# Patient Record
Sex: Female | Born: 1985 | Race: White | Hispanic: No | Marital: Married | Smoking: Current every day smoker
Health system: Southern US, Community
[De-identification: ages and names within clinical notes are randomized; demographics above are authoritative.]

## PROBLEM LIST (undated history)

## (undated) DIAGNOSIS — I10 Essential (primary) hypertension: Secondary | ICD-10-CM

## (undated) DIAGNOSIS — J45909 Unspecified asthma, uncomplicated: Secondary | ICD-10-CM

## (undated) HISTORY — PX: TUBAL LIGATION: SHX77

---

## 2021-01-03 ENCOUNTER — Encounter (HOSPITAL_BASED_OUTPATIENT_CLINIC_OR_DEPARTMENT_OTHER): Payer: Self-pay

## 2021-01-03 ENCOUNTER — Emergency Department (HOSPITAL_BASED_OUTPATIENT_CLINIC_OR_DEPARTMENT_OTHER)
Admission: EM | Admit: 2021-01-03 | Discharge: 2021-01-03 | Discharge status: Absent without leave | Payer: BC Managed Care – PPO

## 2021-01-03 ENCOUNTER — Emergency Department (HOSPITAL_BASED_OUTPATIENT_CLINIC_OR_DEPARTMENT_OTHER): Payer: BC Managed Care – PPO

## 2021-01-03 ENCOUNTER — Emergency Department (HOSPITAL_BASED_OUTPATIENT_CLINIC_OR_DEPARTMENT_OTHER)
Admission: EM | Admit: 2021-01-03 | Discharge: 2021-01-03 | Disposition: A | Discharge status: Home / Self Care | Payer: BC Managed Care – PPO | Attending: Emergency Medicine | Admitting: Emergency Medicine

## 2021-01-03 ENCOUNTER — Other Ambulatory Visit: Payer: Self-pay

## 2021-01-03 DIAGNOSIS — R519 Headache, unspecified: Secondary | ICD-10-CM | POA: Diagnosis present

## 2021-01-03 DIAGNOSIS — J45909 Unspecified asthma, uncomplicated: Secondary | ICD-10-CM | POA: Diagnosis not present

## 2021-01-03 DIAGNOSIS — H53149 Visual discomfort, unspecified: Secondary | ICD-10-CM | POA: Diagnosis not present

## 2021-01-03 DIAGNOSIS — F17200 Nicotine dependence, unspecified, uncomplicated: Secondary | ICD-10-CM | POA: Insufficient documentation

## 2021-01-03 DIAGNOSIS — I10 Essential (primary) hypertension: Secondary | ICD-10-CM | POA: Diagnosis not present

## 2021-01-03 DIAGNOSIS — Z79899 Other long term (current) drug therapy: Secondary | ICD-10-CM | POA: Diagnosis not present

## 2021-01-03 HISTORY — DX: Unspecified asthma, uncomplicated: J45.909

## 2021-01-03 HISTORY — DX: Essential (primary) hypertension: I10

## 2021-01-03 LAB — CBC
HCT: 48.5 % — ABNORMAL HIGH (ref 36.0–46.0)
Hemoglobin: 16.7 g/dL — ABNORMAL HIGH (ref 12.0–15.0)
MCH: 29.8 pg (ref 26.0–34.0)
MCHC: 34.4 g/dL (ref 30.0–36.0)
MCV: 86.6 fL (ref 80.0–100.0)
Platelets: 375 10*3/uL (ref 150–400)
RBC: 5.6 MIL/uL — ABNORMAL HIGH (ref 3.87–5.11)
RDW: 12.9 % (ref 11.5–15.5)
WBC: 11.6 10*3/uL — ABNORMAL HIGH (ref 4.0–10.5)
nRBC: 0 % (ref 0.0–0.2)

## 2021-01-03 LAB — PREGNANCY, URINE: Preg Test, Ur: NEGATIVE

## 2021-01-03 LAB — BASIC METABOLIC PANEL
Anion gap: 12 (ref 5–15)
BUN: 16 mg/dL (ref 6–20)
CO2: 25 mmol/L (ref 22–32)
Calcium: 10.4 mg/dL — ABNORMAL HIGH (ref 8.9–10.3)
Chloride: 100 mmol/L (ref 98–111)
Creatinine, Ser: 0.77 mg/dL (ref 0.44–1.00)
GFR, Estimated: >60 mL/min (ref >60)
Glucose, Bld: 97 mg/dL (ref 70–99)
Potassium: 3.8 mmol/L (ref 3.5–5.1)
Sodium: 137 mmol/L (ref 135–145)

## 2021-01-03 LAB — TROPONIN I (HIGH SENSITIVITY)
Troponin I (High Sensitivity): 6 ng/L (ref <18)
Troponin I (High Sensitivity): 7 ng/L (ref <18)

## 2021-01-03 MED ORDER — ONDANSETRON 4 MG PO TBDP
4.0000 mg | ORAL_TABLET | Freq: Once | ORAL | Status: AC
  Administered 2021-01-03: 4 mg via ORAL
  Filled 2021-01-03: qty 1

## 2021-01-03 MED ORDER — DIPHENHYDRAMINE HCL 50 MG/ML IJ SOLN
25.0000 mg | Freq: Once | INTRAMUSCULAR | Status: AC
  Administered 2021-01-03: 25 mg via INTRAVENOUS
  Filled 2021-01-03: qty 1

## 2021-01-03 MED ORDER — CLONIDINE HCL 0.1 MG PO TABS: 0.1000 mg | ORAL_TABLET | Freq: Two times a day (BID) | ORAL | 0 refills | Status: AC

## 2021-01-03 MED ORDER — SODIUM CHLORIDE 0.9 % IV BOLUS
500.0000 mL | Freq: Once | INTRAVENOUS | Status: AC
  Administered 2021-01-03: 500 mL via INTRAVENOUS

## 2021-01-03 MED ORDER — IOHEXOL 350 MG/ML SOLN
100.0000 mL | Freq: Once | INTRAVENOUS | Status: AC | PRN
  Administered 2021-01-03: 75 mL via INTRAVENOUS

## 2021-01-03 MED ORDER — ATENOLOL 25 MG PO TABS
50.0000 mg | ORAL_TABLET | Freq: Once | ORAL | Status: AC
  Administered 2021-01-03: 50 mg via ORAL
  Filled 2021-01-03: qty 2

## 2021-01-03 MED ORDER — ATENOLOL 50 MG PO TABS: 50.0000 mg | ORAL_TABLET | Freq: Every day | ORAL | 0 refills | Status: AC

## 2021-01-03 MED ORDER — CLONIDINE HCL 0.1 MG PO TABS
0.1000 mg | ORAL_TABLET | Freq: Once | ORAL | Status: AC
  Administered 2021-01-03: 0.1 mg via ORAL
  Filled 2021-01-03: qty 1

## 2021-01-03 MED ORDER — PROCHLORPERAZINE EDISYLATE 10 MG/2ML IJ SOLN
10.0000 mg | Freq: Once | INTRAMUSCULAR | Status: AC
  Administered 2021-01-03: 10 mg via INTRAVENOUS
  Filled 2021-01-03: qty 2

## 2021-01-03 NOTE — ED Provider Notes (Signed)
[facility] Provider Note   CSN: [id number] Arrival date & time: 01/03/21  [id number]     History Chief Complaint  Patient presents with   Headache   Medication Refill    Sharon Quinn is a 35 y.o. female with a past medical history of hypertension who presents to the Emergency Department complaining of sudden intermittent headache onset 4 days. Her headache is localized to occipital, frontal, and behind the eyes and is a tingling sensation. Patient was lying in bed when her headache started and woke her. She was not evaluated for her headache when she first noticed it. Patient denies any alleviating factors for her headache.  Patient's headache is exacerbated with lights and sexual intercourse.  Patient has had headaches in the past however no headaches like her current episode. Patient has associated symptoms of photophobia, seeing spots x 1 week, lightheadedness,  shortness of breath, and nausea. She just moved to the area from [place], [place]. She has been out of her hypertension medications (atenolol and clonidine) for 1 week. She does not currently check her blood pressure at home.  Patient denies any missed hypertension medications prior to this current incident. Patient denies numbness, tingling, abdominal pain, vomiting, back pain, flank pain, dysuria, or hematuria. Patient reports allergy to hydrochlorothiazide and penicillins.  Patient has a family history of her grandparents with MI.  Patient denies family history of SAH.   Patient also has intermittent left-sided non-radiating chest pain x today.  Describes her chest pain is rated 8 out of 10 and she first noticed it at rest.  Denies shortness of breath.  Patient denies any similar symptoms in the past.    The history is provided by the patient. No language interpreter was used.      Past Medical History:  Diagnosis Date   Asthma    Hypertension     There are no problems to display for this  patient.   Past Surgical History:  Procedure Laterality Date   CESAREAN SECTION     TUBAL LIGATION       OB History   No obstetric history on file.     No family history on file.  Social History   Tobacco Use   Smoking status: Every Day   Smokeless tobacco: Never  Vaping Use   Vaping Use: Every day  Substance Use Topics   Alcohol use: Yes    Comment: occ   Drug use: Never    Home Medications Prior to Admission medications   Medication Sig Start Date End Date Taking? Authorizing Provider  atenolol (TENORMIN) 50 MG tablet Take 1 tablet (50 mg total) by mouth daily. 01/03/21  Yes Shanitha Twining A, PA  cloNIDine (CATAPRES) 0.1 MG tablet Take 1 tablet (0.1 mg total) by mouth 2 (two) times daily. 01/03/21  Yes Shereen Marton A, PA    Allergies    Penicillins and Hydrochlorothiazide  Review of Systems   Review of Systems  Eyes:  Positive for photophobia and visual disturbance.  Respiratory:  Negative for shortness of breath.   Cardiovascular:  Positive for chest pain.  Gastrointestinal:  Positive for nausea. Negative for abdominal pain and vomiting.  Genitourinary:  Negative for dysuria, flank pain and hematuria.  Musculoskeletal:  Negative for back pain.  Skin:  Negative for rash.  Neurological:  Positive for headaches. Negative for weakness and numbness.       -Tingling   Physical Exam Updated Vital Signs BP (!) 231/155 (BP Location: Left  Arm) Comment: No HTN meds x 1 week  Pulse 85   Temp 98.3 F (36.8 C) (Oral)   Resp 18   Ht 5\' 7"  (1.702 m)   Wt 94.3 kg   LMP 12/05/2020 (Approximate)   SpO2 100%   BMI 32.58 kg/m    Physical Exam Vitals and nursing note reviewed.  Constitutional:      General: She is not in acute distress.    Appearance: She is well-developed.  HENT:     Head: Normocephalic and atraumatic.     Right Ear: Tympanic membrane normal.     Left Ear: Tympanic membrane normal.     Nose: Nose normal.     Right Sinus: No maxillary sinus  tenderness or frontal sinus tenderness.     Left Sinus: No maxillary sinus tenderness or frontal sinus tenderness.     Mouth/Throat:     Mouth: Mucous membranes are moist.     Pharynx: Oropharynx is clear. Uvula midline. No oropharyngeal exudate or posterior oropharyngeal erythema.     Tonsils: No tonsillar abscesses.  Eyes:     General: No scleral icterus.       Right eye: No discharge.        Left eye: No discharge.     Extraocular Movements: Extraocular movements intact.     Conjunctiva/sclera: Conjunctivae normal.     Pupils: Pupils are equal, round, and reactive to light.  Cardiovascular:     Rate and Rhythm: Normal rate and regular rhythm.     Pulses: Normal pulses.     Heart sounds: Normal heart sounds.  Pulmonary:     Effort: Pulmonary effort is normal. No respiratory distress.     Breath sounds: Normal breath sounds. No wheezing or rales.  Chest:     Chest wall: No tenderness.     Comments: No chest wall tenderness to palpation. No obvious step offs or deformities.  Abdominal:     General: Bowel sounds are normal. There is no distension.     Palpations: Abdomen is soft. There is no mass.     Tenderness: There is no abdominal tenderness. There is no guarding or rebound.     Comments: Negative CVA tenderness to palpation bilaterally.  Musculoskeletal:        General: Normal range of motion.     Cervical back: Normal range of motion and neck supple.     Comments: Full active ROM of cervical, thoracic, and lumbar spine. No tenderness to palpation of C, T, L, S spinous or paraspinal musculature. No obvious bony step-offs, rash, deformity, or crepitus noted. Sensation and strength intact to bilateral upper and lower extremities. Grip strength 5/5 bilaterally. Pt is able to ambulate without assistance or difficulty.    Lymphadenopathy:     Cervical: No cervical adenopathy.  Skin:    General: Skin is warm and dry.     Findings: No rash.  Neurological:     General: No focal  deficit present.     Mental Status: She is alert and oriented to person, place, and time.     Cranial Nerves: No cranial nerve deficit.     Sensory: No sensory deficit.     Coordination: Coordination normal.  Psychiatric:        Behavior: Behavior normal.    ED Results / Procedures / Treatments   Labs (all labs ordered are listed, but only abnormal results are displayed) Labs Reviewed  BASIC METABOLIC PANEL - Abnormal; Notable for the following components:  Result Value   Calcium 10.4 (*)    All other components within normal limits  CBC - Abnormal; Notable for the following components:   WBC 11.6 (*)    RBC 5.60 (*)    Hemoglobin 16.7 (*)    HCT 48.5 (*)    All other components within normal limits  PREGNANCY, URINE  TROPONIN I (HIGH SENSITIVITY)  TROPONIN I (HIGH SENSITIVITY)    EKG EKG Interpretation  Date/Time:  Thursday January 03 2021 10:04:09 EDT Ventricular Rate:  82 PR Interval:  167 QRS Duration: 91 QT Interval:  365 QTC Calculation: 427 R Axis:   19 Text Interpretation: Sinus rhythm Probable left atrial enlargement ST elev, probable normal early repol pattern Baseline wander in lead(s) I III aVL V1 V2 V3 V4 V5 V6 No prior ECG for comparison. Baseline wander. No STEMI Confirmed by Antony Blackbird [phone]) on 01/03/2021 11:27:43 AM  Radiology CT ANGIO HEAD NECK W WO CM  Result Date: 01/03/2021 CLINICAL DATA:  Stroke/TIA, assess intracranial arteries EXAM: CT ANGIOGRAPHY HEAD AND NECK TECHNIQUE: Multidetector CT imaging of the head and neck was performed using the standard protocol during bolus administration of intravenous contrast. Multiplanar CT image reconstructions and MIPs were obtained to evaluate the vascular anatomy. Carotid stenosis measurements (when applicable) are obtained utilizing NASCET criteria, using the distal internal carotid diameter as the denominator. CONTRAST:  40mL OMNIPAQUE IOHEXOL 350 MG/ML SOLN COMPARISON:  None. FINDINGS: CT HEAD  Brain: There is no acute intracranial hemorrhage, mass effect, or edema. Gray-white differentiation is preserved. There is no extra-axial fluid collection. Ventricles and sulci are within normal limits in size and configuration. Vascular: Better evaluated on CTA portion. Skull: Calvarium is unremarkable. Sinuses/Orbits: No acute finding. Other: None. Review of the MIP images confirms the above findings CTA NECK Aortic arch: Great vessel origins are patent. There is minimal calcified plaque along the arch. Right carotid system: Patent.  No stenosis. Left carotid system: Patent.  No stenosis. Vertebral arteries: Patent. Right vertebral is slightly dominant. No stenosis. Skeleton: No significant abnormality. Other neck: Unremarkable. Upper chest: Included upper lungs are clear. Review of the MIP images confirms the above findings CTA HEAD Anterior circulation: Intracranial internal carotid arteries are patent. Anterior and middle cerebral arteries are patent. Posterior circulation: Intracranial vertebral arteries are patent. Basilar artery is patent. Major cerebellar artery origins are patent. Posterior cerebral arteries are patent. A right posterior communicating artery is present. Venous sinuses: Patent as allowed by contrast bolus timing. Review of the MIP images confirms the above findings IMPRESSION: No acute intracranial abnormality. No large vessel occlusion, hemodynamically significant stenosis, or evidence of dissection. Electronically Signed   By: Macy Mis M.D.   On: 01/03/2021 13:28   DG Chest Port 1 View  Result Date: 01/03/2021 CLINICAL DATA:  Chest pain 4 days EXAM: PORTABLE CHEST 1 VIEW COMPARISON:  None. FINDINGS: The heart size and mediastinal contours are within normal limits. Both lungs are clear. The visualized skeletal structures are unremarkable. IMPRESSION: No active disease. Electronically Signed   By: Franchot Gallo M.D.   On: 01/03/2021 10:57    Procedures Procedures    Medications Ordered in ED Medications  ondansetron (ZOFRAN-ODT) disintegrating tablet 4 mg (4 mg Oral Given 01/03/21 1051)  sodium chloride 0.9 % bolus 500 mL (0 mLs Intravenous Stopped 01/03/21 1211)  diphenhydrAMINE (BENADRYL) injection 25 mg (25 mg Intravenous Given 01/03/21 1052)  prochlorperazine (COMPAZINE) injection 10 mg (10 mg Intravenous Given 01/03/21 1053)  atenolol (TENORMIN) tablet 50 mg (50 mg  Oral Given 01/03/21 1051)  cloNIDine (CATAPRES) tablet 0.1 mg (0.1 mg Oral Given 01/03/21 1051)  iohexol (OMNIPAQUE) 350 MG/ML injection 100 mL (75 mLs Intravenous Contrast Given 01/03/21 1259)    ED Course  I have reviewed the triage vital signs and the nursing notes.  Pertinent labs & imaging results that were available during my care of the patient were reviewed by me and considered in my medical decision making (see chart for details).  11:33 AM -recheck with patient. Patient is resting comfortably on stretcher and reports that her headache feels slightly better.  1:53 PM -patient rechecked and reevaluated prior to discharge.  Patient headache improved and patient resting comfortably on stretcher. Discussed with patient reasons to return to the emergency department.  Patient voices understanding.    MDM Rules/Calculators/A&P                          Pt presented to the ED with intermittent headache x 4 days. Pt was out of her HTN medications (Atenolol and Clonidine) x 1 week due to recently moving to the area from [place], [place]. Patient also with intermittent left sided chest pain while in the ED. Initial blood pressure elevated at 231/155 on arrival. Differential diagnosis includes SAH, ICH, or Hypertensive urgency. Pt is afebrile with no focal neuro deficits. Patient given migraine cocktail and home doses of hypertension medication (Atenolol 50 mg and Clonidine 0.1 mg).   EKG without acute ST/T changes. Labs without acute findings. Troponins within normal limits. CTA Head/Neck and CXR  obtained. CTA Head/Neck showed no acute intracranial bleed. CXR without cardiomegaly or acute cardiac or respiratory findings. Patient headache improved with migraine cocktail and at home HTN medications. Presentation less likely due to [facility] or ICH. This is likely headache in the setting of hypertensive urgency due to missed HTN medications. Repeat vital signs with blood pressure improved to 132/89. Patient resting comfortably on stretcher.    Will provide patient with 30-day prescription of atenolol and clonidine and primary care resources for follow up and further management of hypertension. Stressed importance of taking hypertension medications as prescribed and getting a follow up appointment with a primary care provider. Pt acknowledges and verbalizes understanding and is agreeable to plan. Discussed strict return precautions with patient consisting of increasing persistent chest pain, increasing persistent shortness of breath, sudden onset headache.  Patient appears safe for discharge at this time.  Follow-up as indicated in the discharge paperwork.   Final Clinical Impression(s) / ED Diagnoses Final diagnoses:  Primary hypertension  Acute nonintractable headache, unspecified headache type    Rx / DC Orders ED Discharge Orders          Ordered    atenolol (TENORMIN) 50 MG tablet  Daily        01/03/21 1348    cloNIDine (CATAPRES) 0.1 MG tablet  2 times daily        01/03/21 1348             Ahnika Hannibal A, PA 01/04/21 0030    Tegeler, Gwenyth Allegra, MD 01/04/21 [phone]

## 2021-01-03 NOTE — Discharge Instructions (Addendum)
Document given with resources for primary care provider.  Please call and set up an appointment within the next week with a primary care provider.  Continue to monitor blood pressure at home with an at home cuff.  Return to the emergency department if you are experiencing worsening persistent chest pain, worsening persistent shortness of breath, new onset headache, or persistent symptoms.

## 2021-01-03 NOTE — ED Triage Notes (Signed)
Pt c/o intermittent headache and neck pain x 4 days. Pain score 8/10.  Reports intermittently seeing spots.  Pt reports she has been out of HTN medications x 1 week.     HTN meds: Atenolol 50mg  q day                    Clonidine 0.1mg  BID

## 2022-11-23 IMAGING — CT CT ANGIO HEAD-NECK (W OR W/O PERF)
1 of 11 series · 5 of 33 positions shown · IV contrast (Omnipaque)
Comparison: None.

CLINICAL DATA: Stroke/TIA, assess intracranial arteries

EXAM:
CT ANGIOGRAPHY HEAD AND NECK
TECHNIQUE: Multidetector CT imaging of the head and neck was performed using
the standard protocol during bolus administration of intravenous
contrast. Multiplanar CT image reconstructions and MIPs were
obtained to evaluate the vascular anatomy. Carotid stenosis
measurements (when applicable) are obtained utilizing NASCET
criteria, using the distal internal carotid diameter as the
denominator.
CONTRAST:  75mL OMNIPAQUE IOHEXOL 350 MG/ML SOLN

[Series 12: axial thin · axial · 0.41mm/px · z∈[-205,+26]mm · 5 of 347 slices shown]
[im 58/347  soft-tissue]
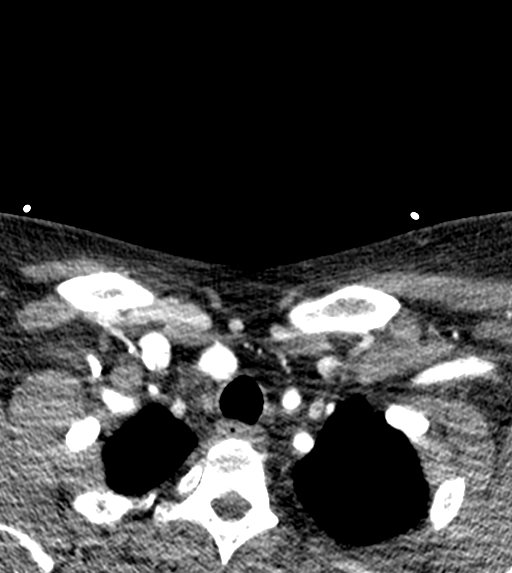
[im 116/347  bone]
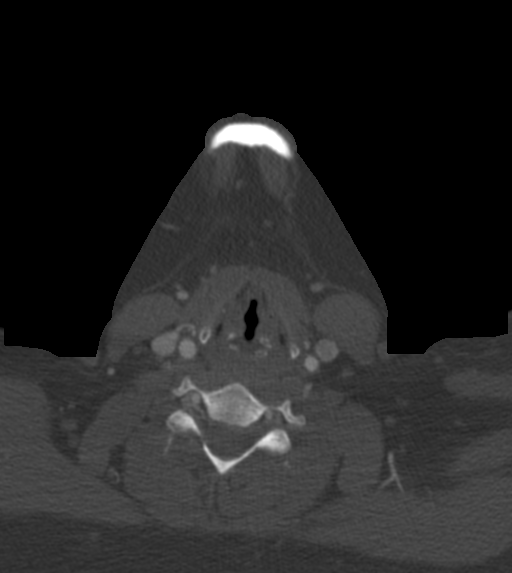
[im 174/347  soft-tissue]
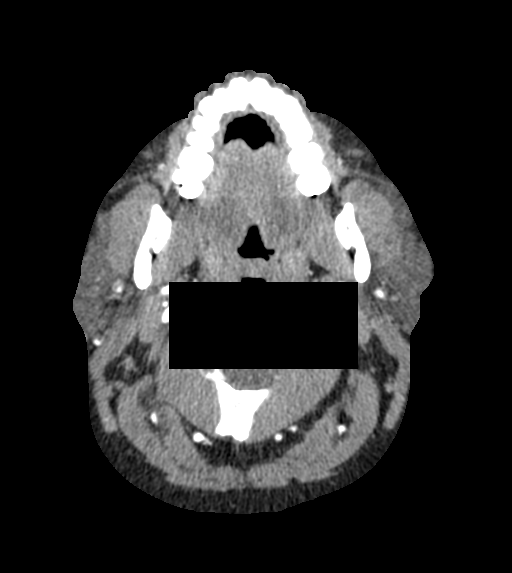
[im 231/347  bone]
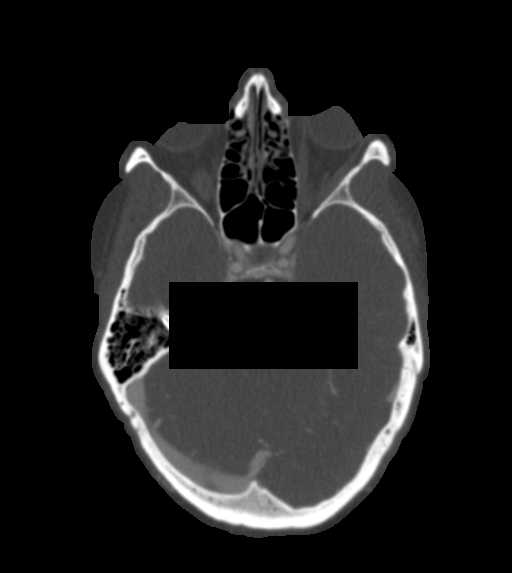
[im 289/347  soft-tissue]
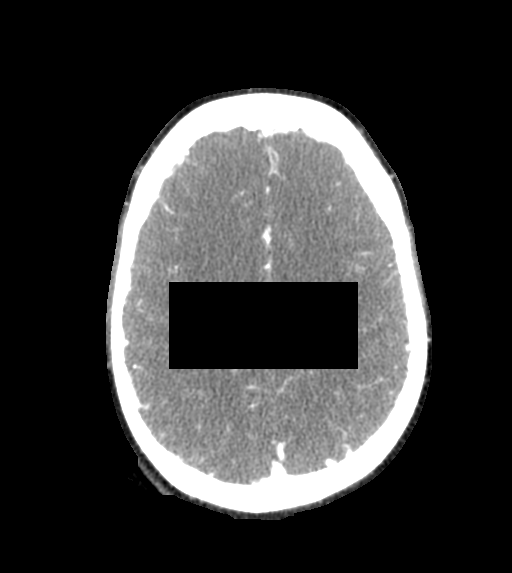

[5 of 33 positions shown; findings below may reference images not displayed]

FINDINGS: CT HEAD

Brain: There is no acute intracranial hemorrhage, mass effect, or
edema. Gray-white differentiation is preserved. There is no
extra-axial fluid collection. Ventricles and sulci are within normal
limits in size and configuration.

Vascular: Better evaluated on CTA portion.

Skull: Calvarium is unremarkable.

Sinuses/Orbits: No acute finding.

Other: None.

Review of the MIP images confirms the above findings

CTA NECK

Aortic arch: Great vessel origins are patent. There is minimal
calcified plaque along the arch.

Right carotid system: Patent.  No stenosis.

Left carotid system: Patent.  No stenosis.

Vertebral arteries: Patent. Right vertebral is slightly dominant. No
stenosis.

Skeleton: No significant abnormality.

Other neck: Unremarkable.

Upper chest: Included upper lungs are clear.

Review of the MIP images confirms the above findings

CTA HEAD

Anterior circulation: Intracranial internal carotid arteries are
patent. Anterior and middle cerebral arteries are patent.

Posterior circulation: Intracranial vertebral arteries are patent.
Basilar artery is patent. Major cerebellar artery origins are
patent. Posterior cerebral arteries are patent. A right posterior
communicating artery is present.

Venous sinuses: Patent as allowed by contrast bolus timing.

Review of the MIP images confirms the above findings
IMPRESSION: No acute intracranial abnormality.

No large vessel occlusion, hemodynamically significant stenosis, or
evidence of dissection.
